# Patient Record
Sex: Male | Born: 1980 | Race: Black or African American | Hispanic: No | Marital: Single | Smoking: Current every day smoker
Health system: Southern US, Community
[De-identification: ages and names within clinical notes are randomized; demographics above are authoritative.]

## PROBLEM LIST (undated history)

## (undated) HISTORY — PX: ANKLE FRACTURE SURGERY: SHX122

---

## 2020-11-03 ENCOUNTER — Encounter (HOSPITAL_COMMUNITY): Payer: Self-pay

## 2020-11-03 ENCOUNTER — Emergency Department (HOSPITAL_COMMUNITY)
Admission: EM | Admit: 2020-11-03 | Discharge: 2020-11-03 | Disposition: A | Discharge status: Home / Self Care | Payer: Self-pay | Attending: Emergency Medicine | Admitting: Emergency Medicine

## 2020-11-03 ENCOUNTER — Other Ambulatory Visit: Payer: Self-pay

## 2020-11-03 ENCOUNTER — Emergency Department (HOSPITAL_COMMUNITY): Payer: Self-pay

## 2020-11-03 ENCOUNTER — Encounter (HOSPITAL_COMMUNITY)
Admission: EM | Disposition: A | Discharge status: Home / Self Care | Payer: Self-pay | Source: Home / Self Care | Attending: Emergency Medicine

## 2020-11-03 DIAGNOSIS — X58XXXA Exposure to other specified factors, initial encounter: Secondary | ICD-10-CM | POA: Insufficient documentation

## 2020-11-03 DIAGNOSIS — F1290 Cannabis use, unspecified, uncomplicated: Secondary | ICD-10-CM | POA: Insufficient documentation

## 2020-11-03 DIAGNOSIS — F17210 Nicotine dependence, cigarettes, uncomplicated: Secondary | ICD-10-CM | POA: Insufficient documentation

## 2020-11-03 DIAGNOSIS — T185XXA Foreign body in anus and rectum, initial encounter: Secondary | ICD-10-CM | POA: Insufficient documentation

## 2020-11-03 HISTORY — PX: FOREIGN BODY REMOVAL: SHX962

## 2020-11-03 HISTORY — PX: FLEXIBLE SIGMOIDOSCOPY: SHX5431

## 2020-11-03 SURGERY — SIGMOIDOSCOPY, FLEXIBLE
Anesthesia: Moderate Sedation

## 2020-11-03 MED ORDER — DIAZEPAM 5 MG PO TABS
5.0000 mg | ORAL_TABLET | Freq: Once | ORAL | Status: AC
  Administered 2020-11-03: 5 mg via ORAL
  Filled 2020-11-03: qty 1

## 2020-11-03 MED ORDER — FENTANYL CITRATE (PF) 100 MCG/2ML IJ SOLN
INTRAMUSCULAR | Status: AC
  Filled 2020-11-03: qty 2

## 2020-11-03 MED ORDER — LIDOCAINE HCL URETHRAL/MUCOSAL 2 % EX GEL: 1.0000 "application " | Freq: Once | CUTANEOUS | Status: DC

## 2020-11-03 MED ORDER — SODIUM CHLORIDE 0.9 % IV SOLN: INTRAVENOUS | Status: DC

## 2020-11-03 MED ORDER — MIDAZOLAM HCL (PF) 5 MG/ML IJ SOLN
INTRAMUSCULAR | Status: AC
  Filled 2020-11-03: qty 3

## 2020-11-03 MED ORDER — FENTANYL CITRATE PF 50 MCG/ML IJ SOSY
100.0000 ug | PREFILLED_SYRINGE | Freq: Once | INTRAMUSCULAR | Status: AC
  Administered 2020-11-03: 100 ug via INTRAVENOUS
  Filled 2020-11-03: qty 2

## 2020-11-03 MED ORDER — FENTANYL CITRATE (PF) 100 MCG/2ML IJ SOLN
INTRAMUSCULAR | Status: DC | PRN
  Administered 2020-11-03: 25 ug via INTRAVENOUS
  Administered 2020-11-03: 50 ug via INTRAVENOUS
  Administered 2020-11-03: 25 ug via INTRAVENOUS

## 2020-11-03 MED ORDER — LIDOCAINE HCL URETHRAL/MUCOSAL 2 % EX GEL
1.0000 "application " | Freq: Once | CUTANEOUS | Status: AC
  Administered 2020-11-03: 1 via TOPICAL
  Filled 2020-11-03: qty 11

## 2020-11-03 MED ORDER — MIDAZOLAM HCL (PF) 10 MG/2ML IJ SOLN
INTRAMUSCULAR | Status: DC | PRN
  Administered 2020-11-03 (×2): 2 mg via INTRAVENOUS
  Administered 2020-11-03: 1 mg via INTRAVENOUS
  Administered 2020-11-03 (×2): 2 mg via INTRAVENOUS

## 2020-11-03 NOTE — ED Provider Notes (Signed)
  Provider Note MRN:  [id number]  Arrival date & time: 11/03/20    ED Course and Medical Decision Making  Assumed care from Dr. Renaye Rakers at shift change.  Patient underwent procedural sedation for rectal foreign body, successful removal.  On reassessment patient is ambulatory, actively conversant, requesting discharge.  Procedures  Final Clinical Impressions(s) / ED Diagnoses     ICD-10-CM   1. Foreign body in anus and rectum, initial encounter  T18.Blythe.Garin       ED Discharge Orders     None         Discharge Instructions      You were evaluated in the Emergency Department and after careful evaluation, we did not find any emergent condition requiring admission or further testing in the hospital.  Your exam/testing today was overall reassuring.  Please return to the Emergency Department if you experience any worsening of your condition.  Thank you for allowing [place] to be a part of your care.     Elmer Sow. Pilar Plate, MD [facility] Emergency Medicine [facility] [email or web address]    Sabas Sous, MD 11/03/20 [phone]

## 2020-11-03 NOTE — ED Provider Notes (Signed)
[facility] EMERGENCY DEPARTMENT Provider Note   CSN: [id number] Arrival date & time: 11/03/20  1719     History Chief Complaint  Patient presents with   Foreign Body in Rectum    Gavin Cole is a 40 y.o. male presented emergency department with concern for foreign body stuck in his rectum.  He reports that this occurred around 9 AM this morning.  He reports that he inserted approximately 6-7 inch "vibrater" into his rectum.  He has not been able to get it out.  This never happened before.  He denies nausea or vomiting.  He is not able to pass gas.   HPI     History reviewed. No pertinent past medical history.  Patient Active Problem List   Diagnosis Date Noted   Foreign body in anus and rectum     Past Surgical History:  Procedure Laterality Date   ANKLE FRACTURE SURGERY Left        History reviewed. No pertinent family history.  Social History   Tobacco Use   Smoking status: Every Day    Types: Cigarettes   Smokeless tobacco: Never  Substance Use Topics   Alcohol use: Yes   Drug use: Yes    Types: Marijuana    Home Medications Prior to Admission medications   Not on File    Allergies    Patient has no known allergies.  Review of Systems   Review of Systems  Constitutional:  Negative for chills and fever.  Eyes:  Negative for photophobia and visual disturbance.  Respiratory:  Negative for cough and shortness of breath.   Cardiovascular:  Negative for chest pain and palpitations.  Gastrointestinal:  Negative for abdominal pain and vomiting.  Genitourinary:  Negative for dysuria and hematuria.  Musculoskeletal:  Negative for arthralgias and myalgias.  Skin:  Negative for color change and rash.  Neurological:  Negative for syncope and headaches.  All other systems reviewed and are negative.  Physical Exam Updated Vital Signs BP 133/70   Pulse (!) 105   Temp 98.3 F (36.8 C) (Oral)   Resp (!) 21   Ht 5\' 9"  (1.753 m)    Wt 108.9 kg   SpO2 97%   BMI 35.44 kg/m   Physical Exam Constitutional:      General: He is not in acute distress. HENT:     Head: Normocephalic and atraumatic.  Eyes:     Conjunctiva/sclera: Conjunctivae normal.     Pupils: Pupils are equal, round, and reactive to light.  Cardiovascular:     Rate and Rhythm: Normal rate and regular rhythm.  Pulmonary:     Effort: Pulmonary effort is normal. No respiratory distress.  Abdominal:     General: There is no distension.     Tenderness: There is no abdominal tenderness.  Genitourinary:    Comments: Rectal exam performed with chaperone EMT present No palpable foreign body No rectal bleeding Skin:    General: Skin is warm and dry.  Neurological:     General: No focal deficit present.     Mental Status: He is alert. Mental status is at baseline.  Psychiatric:        Mood and Affect: Mood normal.        Behavior: Behavior normal.    ED Results / Procedures / Treatments   Labs (all labs ordered are listed, but only abnormal results are displayed) Labs Reviewed - No data to display  EKG None  Radiology DG Abd Portable  1 View  Result Date: 11/03/2020 CLINICAL DATA:  Foreign body in rectum. EXAM: PORTABLE ABDOMEN - 1 VIEW COMPARISON:  None. FINDINGS: The bowel gas pattern is normal. No radio-opaque calculi are seen. A 12.0 cm x 24.0 cm predominately radiopaque foreign body is seen overlying the distal sigmoid colon. IMPRESSION: 1. Normal bowel gas pattern. 2. Radiopaque foreign body within the distal sigmoid colon. Electronically Signed   By: Virgina Norfolk M.D.   On: 11/03/2020 20:31    Procedures Procedures   Medications Ordered in ED Medications  lidocaine (XYLOCAINE) 2 % jelly 1 application (1 application Topical Given 11/03/20 2042)  diazepam (VALIUM) tablet 5 mg (5 mg Oral Given 11/03/20 2042)  fentaNYL (SUBLIMAZE) injection 100 mcg (100 mcg Intravenous Given 11/03/20 2249)    ED Course  I have reviewed the  triage vital signs and the nursing notes.  Pertinent labs & imaging results that were available during my care of the patient were reviewed by me and considered in my medical decision making (see chart for details).  Rectal foreign body No evidence clinically or on films of perforation Patient overall comfortable, stable No signs of SBO  Cannot reach foreign body on digital rectal exam GI consulted and performed procedure at bedside, with conscious sedation, performed by GI team (see specialist notes).  Successfully removed foreign body.  Patient being monitored pending improved sobriety from procedural medications, anticipate discharge home afterwards  Clinical Course as of 11/04/20 0934  Thu Nov 03, 2020  2048 Paged [facility] [MT]  2049 Attempted rectal exam, unable to palpate foreign body, per xray appears to be near sigmoid colon [MT]  2110 Spoke to Dr Hilarie Fredrickson, he will get back to me [MT]  2249 Gi team at bedside performing sigmoidoscopy [MT]  2316 GI team was able to successfully extract the foreign body from the patient's rectum.  We will continue observe mental sedation wears off.  When he is more sober he can be discharged home.  They recommended prescribing some lidocaine cream, which can give the patient, for small rectal tear [MT]    Clinical Course User Index [MT] Tonnie Stillman, Carola Rhine, MD   Final Clinical Impression(s) / ED Diagnoses Final diagnoses:  Foreign body in anus and rectum, initial encounter    Rx / DC Orders ED Discharge Orders     None        Wyvonnia Dusky, MD 11/04/20 [phone]

## 2020-11-03 NOTE — Op Note (Signed)
[facility] Patient Name: Gavin Cole Procedure Date : 11/03/2020 MRN: [id number] Attending MD: Beverley Fiedler , MD Date of Birth: 1980/08/05 CSN: [id number] Age: 40 Admit Type: Emergency Department Procedure:                Flexible Sigmoidoscopy Indications:              Foreign body in the rectum, Abnormal abdominal                            x-ray of the GI tract Providers:                Carie Caddy. Rhea Belton, MD, Janae Sauce. Steele Berg, RN, Beryle Beams, Technician Referring MD:             Dr. Nestor Ramp Emergency Department Medicines:                Fentanyl 100 micrograms IV, Midazolam 9 mg IV Complications:            No immediate complications. Estimated Blood Loss:     Estimated blood loss: none. Procedure:                Pre-Anesthesia Assessment:                           - Prior to the procedure, a History and Physical                            was performed, and patient medications and                            allergies were reviewed. The patient's tolerance of                            previous anesthesia was also reviewed. The risks                            and benefits of the procedure and the sedation                            options and risks were discussed with the patient.                            All questions were answered, and informed consent                            was obtained. Prior Anticoagulants: The patient has                            taken no previous anticoagulant or antiplatelet                            agents. ASA Grade Assessment: II - A patient with  mild systemic disease. After reviewing the risks                            and benefits, the patient was deemed in                            satisfactory condition to undergo the procedure.                           After obtaining informed consent, the scope was                            passed under direct  vision. The CF-HQ190L ([id number])                            Olympus coloscope was introduced through the anus                            and advanced to the sigmoid colon. The flexible                            sigmoidoscopy was accomplished without difficulty.                            The patient tolerated the procedure well. The                            quality of the bowel preparation was excellent. Scope In: Scope Out: Findings:      The digital rectal exam was normal. Initially the foreign body was       unable to be felt (though later was easier palpable as it was advanced       distally).      A large foreign body/plastic phallus was found in the mid rectum       extending past the recto-sigmoid colon and into the sigmoid colon. With       care the scope was able to maneuver above the proximal end of the toy.       Water was lavaged above the toy. Removal of a toy was accomplished with       a snare. The distal end of the toy was able to be captured with the       snare and then with distal pressure and using digital rectal exam the       toy was able to be maneuvered into the anal canal where it was removed       entirely and intact. Once the toy was removed the scope was reinserted       for complete inspection.      A single (solitary) six mm ulcer was found in the distal rectum. This is       secondary to pressure from the distal end of the foreign body. No       bleeding was present.      On retroflexed views in the rectum (after foreign body removal) a       mucosal tear was found in the very distal rectum immediately proximal to  and approaching the dentate line. This measured 5 mm in length with       self-limited oozing. No evidence of full-thickness tear or perforation.      No additional abnormalities were found on retroflexion. Impression:               - Foreign body in the mid rectum extending into the                            sigmoid colon. Removal was  successful as above.                           - A single (solitary), superficial, ulcer in the                            distal rectum due to pressure injury associated                            with foreign body.                           - Mucosal tear in the distal rectum/proximal anal                            canal due to delivery of the foreign body. Moderate Sedation:      Moderate (conscious) sedation was administered by the endoscopy nurse       and supervised by the endoscopist. The following parameters were       monitored: oxygen saturation, heart rate, blood pressure, and response       to care. Total physician intraservice time was 28 minutes. Recommendation:           - Patient has a contact number available for                            emergencies. The signs and symptoms of potential                            delayed complications were discussed with the                            patient. Return to normal activities tomorrow.                            Written discharge instructions were provided to the                            patient.                           - Patient to be observed in the emergency                            department until he clear the post-sedation period.                           -  Advance diet as tolerated.                           - The findings and recommendations were discussed                            with the patient. Procedure Code(s):        --- Professional ---                           [phone], Sigmoidoscopy, flexible; with removal of                            foreign body(s)                           99152, 59, Moderate sedation services provided by                            the same physician or other qualified health care                            professional performing the diagnostic or                            therapeutic service that the sedation supports,                            requiring the presence of an  independent trained                            observer to assist in the monitoring of the                            patient's level of consciousness and physiological                            status; initial 15 minutes of intraservice time,                            patient age 39 years or older                           [phone], Moderate sedation; each additional 15                            minutes intraservice time Diagnosis Code(s):        --- Professional ---                           Q94.7MLY, Foreign body in anus and rectum, initial                            encounter  T18.4XXA, Foreign body in colon, initial encounter                           K62.6, Ulcer of anus and rectum                           S36.69XA, Other injury of rectum, initial encounter                           R93.3, Abnormal findings on diagnostic imaging of                            other parts of digestive tract CPT copyright 2019 [employer]. All rights reserved. The codes documented in this report are preliminary and upon coder review may  be revised to meet current compliance requirements. Beverley Fiedler, MD 11/03/2020 11:38:59 PM This report has been signed electronically. Number of Addenda: 0

## 2020-11-03 NOTE — Consult Note (Signed)
   Referring Provider:   Dr. Renaye Rakers [facility] ED)        Primary Care Physician:  Pcp, No Primary Gastroenterologist:   None, unassigned          Reason for Consultation:  rectal foreign body                 ASSESSMENT /  PLAN   40 yo with rectal foreign body  Rectal foreign body -- nonobstructive at present, xray without complication.  Device is in proximal rectum/rectosigmoid colon.   Exam is benign. --Emergent bedside flex sig with moderate sedation to try and retreive the foreign body under endoscopic guidance --Surgical consult if not successful for general anesthesia and transanal versus transabdominal extraction.    HPI:     Gavin Cole is a 40 y.o. male presenting to ER with rectal foreign body.  Pt reports using vibrator toy earlier today when it became lost/lodged in rectum.  Unable to pass spontaneously at home.  No abd pain.  No n/v.  Pt has not eaten today since foreign body became lodged, only liquids which he has tolerated without complaint.   History reviewed. No pertinent past medical history.  Past Surgical History:  Procedure Laterality Date   ANKLE FRACTURE SURGERY Left    Meds: None   No current facility-administered medications for this encounter.   No current outpatient medications on file.    Allergies as of 11/03/2020   (No Known Allergies)    No family history on file.  Social History   Tobacco Use   Smoking status: Every Day    Types: Cigarettes   Smokeless tobacco: Never  Substance Use Topics   Alcohol use: Yes   Drug use: Yes    Types: Marijuana    Review of Systems: All systems reviewed and negative except where noted in HPI.  Physical Exam: Vital signs in last 24 hours: Temp:  [98.3 F (36.8 C)] 98.3 F (36.8 C) (11/03 1923) Pulse Rate:  [97] 97 (11/03 1923) Resp:  [22] 22 (11/03 1923) BP: (182)/(122) 182/122 (11/03 1923) SpO2:  [99 %] 99 % (11/03 1923) Weight:  [108.9 kg] 108.9 kg (11/03 1959)   General:   Awake,  alert, NAD Psych:  Pleasant, cooperative. Normal mood and affect. Eyes:  Pupils equal, sclera clear, no icterus.    Neck:  Supple; no masses Lungs:  Clear throughout to auscultation.   No wheezes, crackles, or rhonchi.  Heart:  Regular rate and rhythm; no murmurs, no lower extremity edema Abdomen:  Soft, non-distended, nontender, BS active Rectal:  see flex sig report Msk:  Symmetrical without gross deformities. . Neurologic:  Alert and  oriented x4;  grossly normal neurologically. Skin:  Intact without significant lesions or rashes.   .No flowsheet data found. Studies/Results: DG Abd Portable 1 View  Result Date: 11/03/2020 CLINICAL DATA:  Foreign body in rectum. EXAM: PORTABLE ABDOMEN - 1 VIEW COMPARISON:  None. FINDINGS: The bowel gas pattern is normal. No radio-opaque calculi are seen. A 12.0 cm x 24.0 cm predominately radiopaque foreign body is seen overlying the distal sigmoid colon. IMPRESSION: 1. Normal bowel gas pattern. 2. Radiopaque foreign body within the distal sigmoid colon. Electronically Signed   By: Aram Candela M.D.   On: 11/03/2020 20:31    Active Problems:   * No active hospital problems. Carie Caddy. Delsa Walder, M.D. @  11/03/2020, 9:30 PM

## 2020-11-03 NOTE — ED Notes (Signed)
Endo at bedside

## 2020-11-03 NOTE — Discharge Instructions (Addendum)
You were evaluated in the Emergency Department and after careful evaluation, we did not find any emergent condition requiring admission or further testing in the hospital.  Your exam/testing today was overall reassuring.  Please return to the Emergency Department if you experience any worsening of your condition.  Thank you for allowing us to be a part of your care.  

## 2020-11-03 NOTE — ED Notes (Signed)
Dr Trifan at bedside  

## 2020-11-03 NOTE — ED Triage Notes (Signed)
Pt reports a sex toy is inside his rectum, inserted this morning around 0900. He is unable to have a BM

## 2020-11-03 NOTE — ED Provider Notes (Signed)
Emergency Medicine Provider Triage Evaluation Note  Gavin Cole , a 40 y.o. male  was evaluated in triage.  Pt complains of there being a sex toy stuck in his rectum since 9am this morning.  He tried having a bowel movement earlier today, just noted watery stool without blood.  Review of Systems  Positive: Rectal foreign object Negative: Rectal pain, rectal bleeding, abdominal pain  Physical Exam  BP (!) 182/122 (BP Location: Left Arm)   Pulse 97   Temp 98.3 F (36.8 C) (Oral)   Resp (!) 22   Ht 5\' 9"  (1.753 m)   Wt 108.9 kg   SpO2 99%   BMI 35.44 kg/m  Gen:   Awake, no distress   Resp:  Normal effort  MSK:   Moves extremities without difficulty  Other:    Medical Decision Making  Medically screening exam initiated at 8:00 PM.  Appropriate orders placed.  Gavin Cole was informed that the remainder of the evaluation will be completed by another provider, this initial triage assessment does not replace that evaluation, and the importance of remaining in the ED until their evaluation is complete.     Gavin Cole 11/03/20 2001    13/03/22, MD 11/03/20 2041

## 2020-11-06 ENCOUNTER — Encounter (HOSPITAL_COMMUNITY): Payer: Self-pay | Admitting: Internal Medicine

## 2022-10-05 IMAGING — DX DG ABD PORTABLE 1V
1 series · 1 of 1 positions shown · non-contrast
Comparison: None.

CLINICAL DATA: Foreign body in rectum.

EXAM:
PORTABLE ABDOMEN - 1 VIEW

[abdomen]
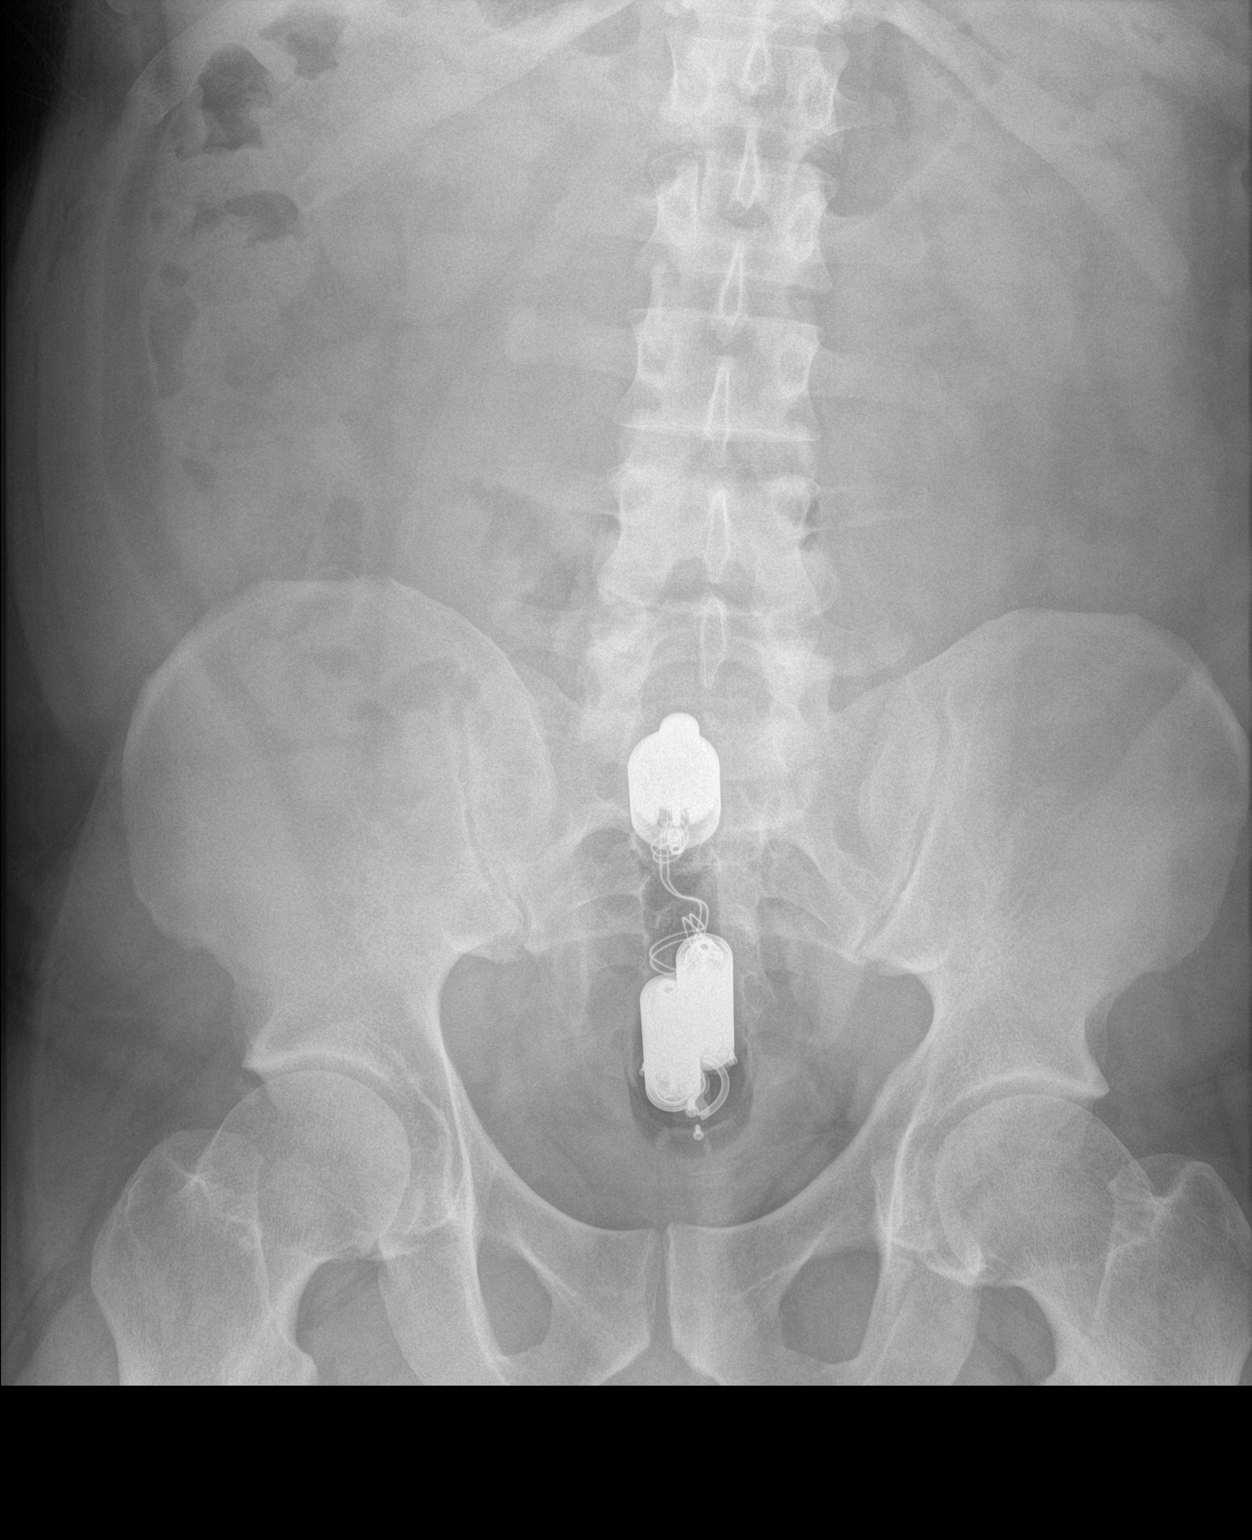

[1 of 1 positions shown; findings below may reference images not displayed]

FINDINGS: The bowel gas pattern is normal. No radio-opaque calculi are seen. A
12.0 cm x 24.0 cm predominately radiopaque foreign body is seen
overlying the distal sigmoid colon.
IMPRESSION: 1. Normal bowel gas pattern.
2. Radiopaque foreign body within the distal sigmoid colon.
